# Patient Record
Sex: Male | Born: 1975 | Race: White | Hispanic: Yes | Smoking: Never smoker
Health system: Southern US, Community
[De-identification: ages and names within clinical notes are randomized; demographics above are authoritative.]

---

## 2008-06-01 ENCOUNTER — Emergency Department (HOSPITAL_COMMUNITY): Admission: EM | Admit: 2008-06-01 | Discharge: 2008-06-01 | Payer: Self-pay | Admitting: *Deleted

## 2019-10-07 ENCOUNTER — Emergency Department (HOSPITAL_BASED_OUTPATIENT_CLINIC_OR_DEPARTMENT_OTHER)
Admission: EM | Admit: 2019-10-07 | Discharge: 2019-10-07 | Disposition: A | Discharge status: Home / Self Care | Payer: Self-pay | Attending: Emergency Medicine | Admitting: Emergency Medicine

## 2019-10-07 ENCOUNTER — Encounter (HOSPITAL_BASED_OUTPATIENT_CLINIC_OR_DEPARTMENT_OTHER): Payer: Self-pay | Admitting: Emergency Medicine

## 2019-10-07 ENCOUNTER — Encounter (HOSPITAL_COMMUNITY): Payer: Self-pay

## 2019-10-07 ENCOUNTER — Other Ambulatory Visit: Payer: Self-pay

## 2019-10-07 ENCOUNTER — Ambulatory Visit (HOSPITAL_COMMUNITY)
Admission: EM | Admit: 2019-10-07 | Discharge: 2019-10-07 | Disposition: A | Discharge status: Home / Self Care | Payer: Self-pay | Attending: Family Medicine | Admitting: Family Medicine

## 2019-10-07 ENCOUNTER — Emergency Department (HOSPITAL_BASED_OUTPATIENT_CLINIC_OR_DEPARTMENT_OTHER): Payer: Self-pay

## 2019-10-07 DIAGNOSIS — N23 Unspecified renal colic: Secondary | ICD-10-CM

## 2019-10-07 DIAGNOSIS — N200 Calculus of kidney: Secondary | ICD-10-CM

## 2019-10-07 DIAGNOSIS — N201 Calculus of ureter: Secondary | ICD-10-CM | POA: Insufficient documentation

## 2019-10-07 DIAGNOSIS — R109 Unspecified abdominal pain: Secondary | ICD-10-CM

## 2019-10-07 LAB — COMPREHENSIVE METABOLIC PANEL
ALT: 28 U/L (ref 0–44)
AST: 27 U/L (ref 15–41)
Albumin: 4.7 g/dL (ref 3.5–5.0)
Alkaline Phosphatase: 71 U/L (ref 38–126)
Anion gap: 10 (ref 5–15)
BUN: 20 mg/dL (ref 6–20)
CO2: 22 mmol/L (ref 22–32)
Calcium: 9.1 mg/dL (ref 8.9–10.3)
Chloride: 104 mmol/L (ref 98–111)
Creatinine, Ser: 1.3 mg/dL — ABNORMAL HIGH (ref 0.61–1.24)
GFR calc Af Amer: >60 mL/min (ref >60)
GFR calc non Af Amer: >60 mL/min (ref >60)
Glucose, Bld: 135 mg/dL — ABNORMAL HIGH (ref 70–99)
Potassium: 4.2 mmol/L (ref 3.5–5.1)
Sodium: 136 mmol/L (ref 135–145)
Total Bilirubin: 1 mg/dL (ref 0.3–1.2)
Total Protein: 7.8 g/dL (ref 6.5–8.1)

## 2019-10-07 LAB — POCT URINALYSIS DIP (DEVICE)
Bilirubin Urine: NEGATIVE
Glucose, UA: NEGATIVE mg/dL
Ketones, ur: NEGATIVE mg/dL
Leukocytes,Ua: NEGATIVE
Nitrite: NEGATIVE
Protein, ur: 100 mg/dL — AB
Specific Gravity, Urine: >=1.03 (ref 1.005–1.030)
Urobilinogen, UA: 1 mg/dL (ref 0.0–1.0)
pH: 6 (ref 5.0–8.0)

## 2019-10-07 LAB — CBC
HCT: 43.2 % (ref 39.0–52.0)
Hemoglobin: 14.5 g/dL (ref 13.0–17.0)
MCH: 30.4 pg (ref 26.0–34.0)
MCHC: 33.6 g/dL (ref 30.0–36.0)
MCV: 90.6 fL (ref 80.0–100.0)
Platelets: 274 10*3/uL (ref 150–400)
RBC: 4.77 MIL/uL (ref 4.22–5.81)
RDW: 12.2 % (ref 11.5–15.5)
WBC: 11.9 10*3/uL — ABNORMAL HIGH (ref 4.0–10.5)
nRBC: 0 % (ref 0.0–0.2)

## 2019-10-07 MED ORDER — IBUPROFEN 600 MG PO TABS: 600.0000 mg | ORAL_TABLET | Freq: Four times a day (QID) | ORAL | 0 refills | Status: AC | PRN

## 2019-10-07 MED ORDER — ONDANSETRON 4 MG PO TBDP
4.0000 mg | ORAL_TABLET | Freq: Once | ORAL | Status: AC
  Administered 2019-10-07: 4 mg via ORAL

## 2019-10-07 MED ORDER — TAMSULOSIN HCL 0.4 MG PO CAPS: 0.4000 mg | ORAL_CAPSULE | Freq: Every day | ORAL | 0 refills | Status: AC

## 2019-10-07 MED ORDER — HYDROCODONE-ACETAMINOPHEN 5-325 MG PO TABS
ORAL_TABLET | ORAL | Status: AC
  Filled 2019-10-07: qty 2

## 2019-10-07 MED ORDER — OXYCODONE-ACETAMINOPHEN 5-325 MG PO TABS: 1.0000 | ORAL_TABLET | Freq: Four times a day (QID) | ORAL | 0 refills | Status: AC | PRN

## 2019-10-07 MED ORDER — HYDROCODONE-ACETAMINOPHEN 5-325 MG PO TABS
2.0000 | ORAL_TABLET | Freq: Once | ORAL | Status: AC
  Administered 2019-10-07: 15:00:00 2 via ORAL

## 2019-10-07 MED ORDER — ONDANSETRON 4 MG PO TBDP
ORAL_TABLET | ORAL | Status: AC
  Filled 2019-10-07: qty 1

## 2019-10-07 NOTE — ED Triage Notes (Signed)
Pt states he has back pain. Pt states he thinks it kidney stones. This started today.

## 2019-10-07 NOTE — Discharge Instructions (Addendum)
I believe you have a Kidney stone. You need at CT Scan and I am recommending you go to the Emergency Department for this.   Go to the Emergency Department of your choice

## 2019-10-07 NOTE — ED Triage Notes (Signed)
R flank pain since 9am with N/V. Sent from UC to r/o kidney stone. Given norco and zofran at [facility] with relief.

## 2019-10-07 NOTE — ED Notes (Signed)
Patient is being discharged from the Urgent Care Center and sent to the Emergency Department via wheelchair by staff. Per Leta Jungling, pa, patient is stable but in need of higher level of care due to evaluation of potential kidney stone. Patient is aware and verbalizes understanding of plan of care.  Vitals:   10/07/19 1337  BP: 136/83  Pulse: 60  Resp: 16  Temp: 98.1 F (36.7 C)  SpO2: 100%

## 2019-10-07 NOTE — ED Provider Notes (Signed)
[facility] EMERGENCY DEPARTMENT Provider Note   CSN: [id number] Arrival date & time: 10/07/19  1508     History Chief Complaint  Patient presents with  . Flank Pain    Zachary Todd is a 44 y.o. male w/ hx of kidney stones (~10 years ago) presenting with 1 day of sudden onset right sided flank pain and vomiting.  Woke up with this this morning.  Sharp pain in right flank that radiates toward groin.  No changes in urination.  Similar to pain from kidney stone many year ago.  No fevers, chills, nausea, vomiting, diarrhea.  No testicular pain.   He says he was given "two pills" at urgent care and his pain has nearly completely resolved.  He went to [facility] and was referred to the ED for CT scan for renal stone & hydronephrosis evaluation   NKDA No other medical problems Takes no meds at baseline  HPI     History reviewed. No pertinent past medical history.  There are no problems to display for this patient.   History reviewed. No pertinent surgical history.     No family history on file.  Social History   Tobacco Use  . Smoking status: Never Smoker  . Smokeless tobacco: Never Used  Substance Use Topics  . Alcohol use: Yes  . Drug use: Not on file    Home Medications Prior to Admission medications   Medication Sig Start Date End Date Taking? Authorizing Provider  ibuprofen (ADVIL) 600 MG tablet Take 1 tablet (600 mg total) by mouth every 6 (six) hours as needed. 10/07/19   Terald Sleeper, MD  oxyCODONE-acetaminophen (PERCOCET/ROXICET) 5-325 MG tablet Take 1 tablet by mouth every 6 (six) hours as needed for up to 3 days for severe pain. 10/07/19 10/10/19  Terald Sleeper, MD  tamsulosin (FLOMAX) 0.4 MG CAPS capsule Take 1 capsule (0.4 mg total) by mouth daily for 15 doses. 10/07/19 10/22/19  Terald Sleeper, MD    Allergies    Patient has no known allergies.  Review of Systems   Review of Systems  Constitutional: Negative for chills and fever.  Respiratory:  Negative for cough and shortness of breath.   Cardiovascular: Negative for chest pain and palpitations.  Gastrointestinal: Positive for abdominal pain. Negative for diarrhea and vomiting.  Genitourinary: Positive for flank pain. Negative for difficulty urinating, discharge, dysuria, hematuria and testicular pain.  Skin: Negative for pallor and rash.  Psychiatric/Behavioral: Negative for agitation and confusion.  All other systems reviewed and are negative.   Physical Exam Updated Vital Signs BP 116/81 (BP Location: Right Arm)   Pulse 62   Temp 97.8 F (36.6 C) (Oral)   Resp 20   Ht 5\' 8"  (1.727 m)   Wt 81.6 kg   SpO2 98%   BMI 27.37 kg/m   Physical Exam Vitals and nursing note reviewed.  Constitutional:      Appearance: He is well-developed.  HENT:     Head: Normocephalic and atraumatic.  Eyes:     Conjunctiva/sclera: Conjunctivae normal.  Cardiovascular:     Rate and Rhythm: Normal rate and regular rhythm.     Pulses: Normal pulses.  Pulmonary:     Effort: Pulmonary effort is normal. No respiratory distress.  Abdominal:     Palpations: Abdomen is soft.     Tenderness: There is no abdominal tenderness. There is no right CVA tenderness, left CVA tenderness, guarding or rebound. Negative signs include Murphy's sign and McBurney's sign.  Musculoskeletal:  Cervical back: Neck supple.  Skin:    General: Skin is warm and dry.  Neurological:     Mental Status: He is alert.  Psychiatric:        Mood and Affect: Mood normal.        Behavior: Behavior normal.     ED Results / Procedures / Treatments   Labs (all labs ordered are listed, but only abnormal results are displayed) Labs Reviewed  CBC - Abnormal; Notable for the following components:      Result Value   WBC 11.9 (*)    All other components within normal limits  COMPREHENSIVE METABOLIC PANEL - Abnormal; Notable for the following components:   Glucose, Bld 135 (*)    Creatinine, Ser 1.30 (*)    All other  components within normal limits    EKG None  Radiology CT Renal Stone Study  Result Date: 10/07/2019 CLINICAL DATA:  Right-sided flank pain for several hours EXAM: CT ABDOMEN AND PELVIS WITHOUT CONTRAST TECHNIQUE: Multidetector CT imaging of the abdomen and pelvis was performed following the standard protocol without IV contrast. COMPARISON:  None. FINDINGS: Lower chest: No acute abnormality. Hepatobiliary: No focal liver abnormality is seen. No gallstones, gallbladder wall thickening, or biliary dilatation. Pancreas: Unremarkable. No pancreatic ductal dilatation or surrounding inflammatory changes. Spleen: Normal in size without focal abnormality. Adrenals/Urinary Tract: Adrenal glands are within normal limits. The left kidney is within normal limits. Right kidney demonstrates mild hydronephrosis secondary to a proximal ureteral stone which measures approximately 4 mm in greatest dimension. The more distal right ureter is unremarkable. The bladder is well distended. Stomach/Bowel: The appendix is within normal limits. No obstructive or inflammatory changes of the large or small bowel are seen. The stomach is decompressed. Vascular/Lymphatic: No significant vascular findings are present. No enlarged abdominal or pelvic lymph nodes. Reproductive: Prostate is unremarkable. Other: No abdominal wall hernia or abnormality. No abdominopelvic ascites. Musculoskeletal: No acute or significant osseous findings. IMPRESSION: 4 mm proximal right ureteral stone with obstructive change. No other focal abnormality is noted. Electronically Signed   By: Inez Catalina M.D.   On: 10/07/2019 17:44    Procedures Procedures (including critical care time)  Medications Ordered in ED Medications - No data to display  ED Course  I have reviewed the triage vital signs and the nursing notes.  Pertinent labs & imaging results that were available during my care of the patient were reviewed by me and considered in my medical  decision making (see chart for details).   This is a well appearing 44 yo male presenting to ED with right sided kidney stone, found on CT here to measure approx 4 mm, with mild right sided hydronephrosis.  Stone is proximal ureter.  UA does not show signs of infection.  I advised patient his pain may return as the stone travels further down the ureter.  Will prescribe pain medications, flomax.  He verbalizes understanding.  No signs of symptoms of pyelonephritis or sepsis at this time.  Final Clinical Impression(s) / ED Diagnoses Final diagnoses:  Right flank pain  Kidney stone  Ureteral colic    Rx / DC Orders ED Discharge Orders         Ordered    oxyCODONE-acetaminophen (PERCOCET/ROXICET) 5-325 MG tablet  Every 6 hours PRN     10/07/19 2034    ibuprofen (ADVIL) 600 MG tablet  Every 6 hours PRN     10/07/19 2034    tamsulosin (FLOMAX) 0.4 MG CAPS capsule  Daily     10/07/19 2034           Terald Sleeper, MD 10/08/19 1213

## 2019-10-07 NOTE — ED Provider Notes (Addendum)
[facility]    CSN: [id number] Arrival date & time: 10/07/19  1118      History   Chief Complaint Chief Complaint  Patient presents with  . Back Pain    HPI Zachary Todd is a 44 y.o. male.   Patient reports to urgent care today for back pain, nausea and vomiting that started suddenly this morning. Patient reports feeling sudden mid to lower back pain followed by nausea and vomiting. He reports pain has been 8/10 constantly and laying down gives him a little relief. Sitting up makes the pain worse. He reports mild decrease in urine production and reports mild painful urination. He denies seeing blood in his urine. He believes the pain is getting worse.   He reports a remote history of a kidney stone and believes this is similar but this pain is worse. He denies fever and chills. No diarrhea.   As well, patient reports being sent to this location from a [facility] We discussed whether he was directed to this urgent care or the Emergency department and he is certain they said urgent care.      History reviewed. No pertinent past medical history.  There are no problems to display for this patient.   History reviewed. No pertinent surgical history.     Home Medications    Prior to Admission medications   Not on File    Family History History reviewed. No pertinent family history.  Social History Social History   Tobacco Use  . Smoking status: Never Smoker  . Smokeless tobacco: Never Used  Substance Use Topics  . Alcohol use: Yes  . Drug use: Not on file     Allergies   Patient has no known allergies.   Review of Systems Review of Systems  Constitutional: Positive for activity change and appetite change. Negative for chills and fatigue.  Respiratory: Negative for cough and shortness of breath.   Cardiovascular: Negative for chest pain.  Gastrointestinal: Positive for nausea and vomiting. Negative for abdominal pain and diarrhea.  Genitourinary:  Positive for decreased urine volume, dysuria and flank pain. Negative for difficulty urinating, discharge, frequency, hematuria, penile pain and urgency.  Musculoskeletal: Positive for back pain.  Skin: Negative for rash.  Neurological: Negative for dizziness and headaches.     Physical Exam Triage Vital Signs ED Triage Vitals  Enc Vitals Group     BP 10/07/19 1337 136/83     Pulse Rate 10/07/19 1337 60     Resp 10/07/19 1337 16     Temp 10/07/19 1337 98.1 F (36.7 C)     Temp Source 10/07/19 1337 Oral     SpO2 10/07/19 1337 100 %     Weight 10/07/19 1342 180 lb (81.6 kg)     Height --      Head Circumference --      Peak Flow --      Pain Score 10/07/19 1342 8     Pain Loc --      Pain Edu? --      Excl. in GC? --    No data found.  Updated Vital Signs BP 136/83 (BP Location: Left Arm)   Pulse 60   Temp 98.1 F (36.7 C) (Oral)   Resp 16   Wt 180 lb (81.6 kg)   SpO2 100%   Visual Acuity Right Eye Distance:   Left Eye Distance:   Bilateral Distance:    Right Eye Near:   Left Eye Near:  Bilateral Near:     Physical Exam Vitals and nursing note reviewed.  Constitutional:      General: He is in acute distress.     Appearance: He is well-developed. He is not diaphoretic.  HENT:     Head: Normocephalic and atraumatic.  Eyes:     Extraocular Movements: Extraocular movements intact.     Conjunctiva/sclera: Conjunctivae normal.     Pupils: Pupils are equal, round, and reactive to light.  Cardiovascular:     Rate and Rhythm: Normal rate.  Pulmonary:     Effort: Pulmonary effort is normal. No respiratory distress.  Abdominal:     Palpations: Abdomen is soft.     Tenderness: There is no abdominal tenderness. There is right CVA tenderness. There is no left CVA tenderness.  Musculoskeletal:     Cervical back: Normal range of motion and neck supple.     Right lower leg: No edema.     Left lower leg: No edema.  Skin:    General: Skin is warm and dry.    Neurological:     General: No focal deficit present.     Mental Status: He is alert and oriented to person, place, and time.  Psychiatric:        Mood and Affect: Mood normal.        Behavior: Behavior normal.        Thought Content: Thought content normal.        Judgment: Judgment normal.      UC Treatments / Results  Labs (all labs ordered are listed, but only abnormal results are displayed) Labs Reviewed  POCT URINALYSIS DIP (DEVICE) - Abnormal; Notable for the following components:      Result Value   Hgb urine dipstick LARGE (*)    Protein, ur 100 (*)    All other components within normal limits  URINE CULTURE    EKG   Radiology No results found.  Procedures Procedures (including critical care time)  Medications Ordered in UC Medications  HYDROcodone-acetaminophen (NORCO/VICODIN) 5-325 MG per tablet 2 tablet (2 tablets Oral Given 10/07/19 1438)  ondansetron (ZOFRAN-ODT) disintegrating tablet 4 mg (4 mg Oral Given 10/07/19 1438)    Initial Impression / Assessment and Plan / UC Course  I have reviewed the triage vital signs and the nursing notes.  Pertinent labs & imaging results that were available during my care of the patient were reviewed by me and considered in my medical decision making (see chart for details).     #kidney Stone - History, exam and UA consistent with kidney stone. Has a history in 2009 of a 25mm ureteral stone. Was treated with flomax and pain meds, passed without issue in 3-4 days. Patient had worsening appearance during visit and increasing pain. Given history and presentation, feel need for imaging today. Recommending to go to Emergency department for CT Abdomen and Pelvis. 2x norco and zofran given in clinic.   Final Clinical Impressions(s) / UC Diagnoses   Final diagnoses:  Kidney stone     Discharge Instructions     I believe you have a Kidney stone. You need at CT Scan and I am recommending you go to the Emergency Department for  this.   Go to the Emergency Department of your choice    ED Prescriptions    None     PDMP not reviewed this encounter.   Purnell Shoemaker, PA-C 10/07/19 1440    Sheron Robin, Marguerita Beards, PA-C 10/07/19 1441

## 2019-10-08 LAB — URINE CULTURE: Culture: <10000 — AB

## 2021-08-17 IMAGING — CT CT RENAL STONE PROTOCOL
2 of 4 series · 17 of 46 positions shown, 19 images · non-contrast
Comparison: None.

CLINICAL DATA: Right-sided flank pain for several hours

EXAM:
CT ABDOMEN AND PELVIS WITHOUT CONTRAST
TECHNIQUE: Multidetector CT imaging of the abdomen and pelvis was performed
following the standard protocol without IV contrast.

[Series 2: axial st · axial · 0.88mm/px · z∈[-580,-120]mm · 14 of 102 slices shown, 16 images]
[im 5/102  soft-tissue]
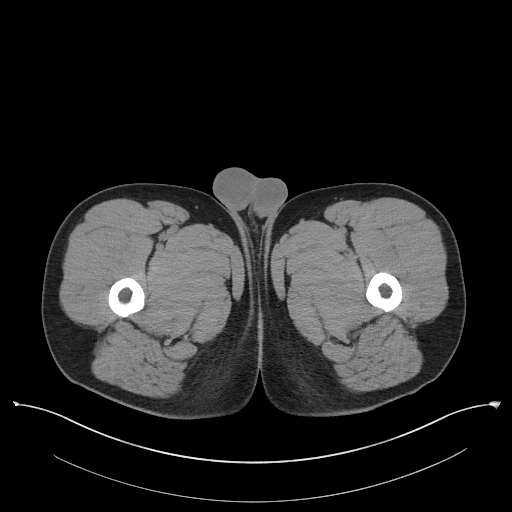
[im 5/102  bone]
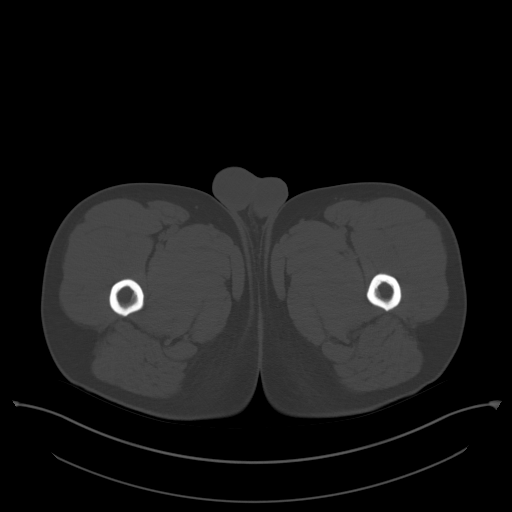
[im 13/102  soft-tissue]
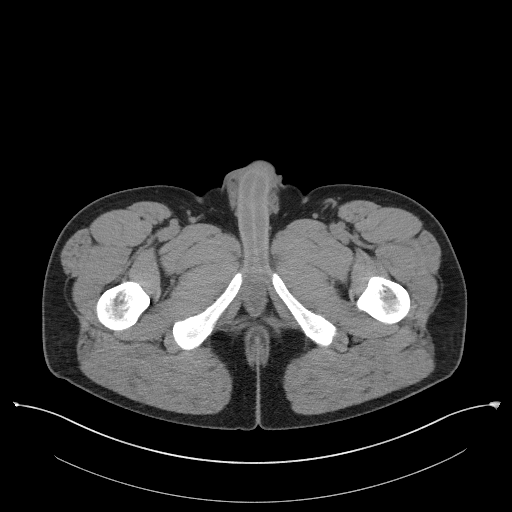
[im 22/102  soft-tissue]
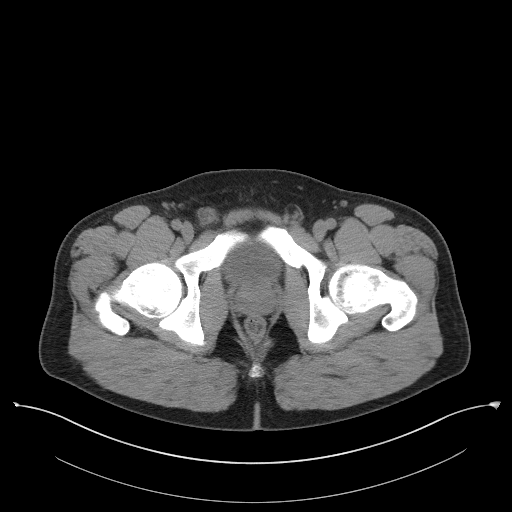
[im 26/102  soft-tissue]
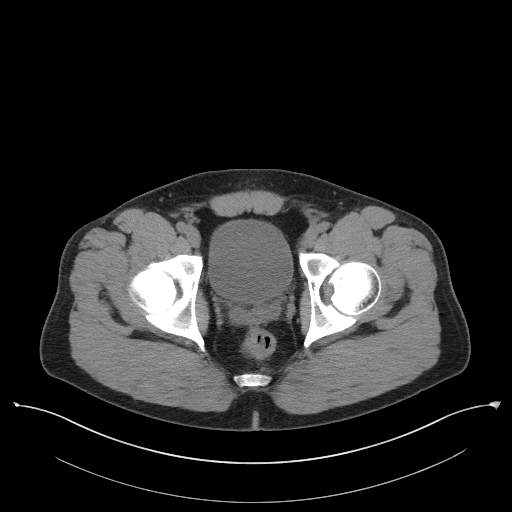
[im 34/102  soft-tissue]
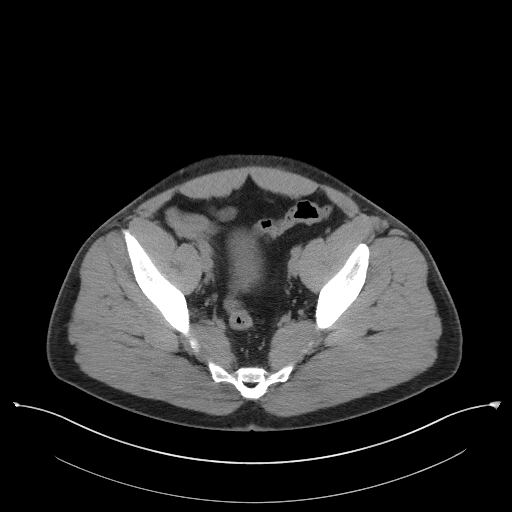
[im 43/102  soft-tissue]
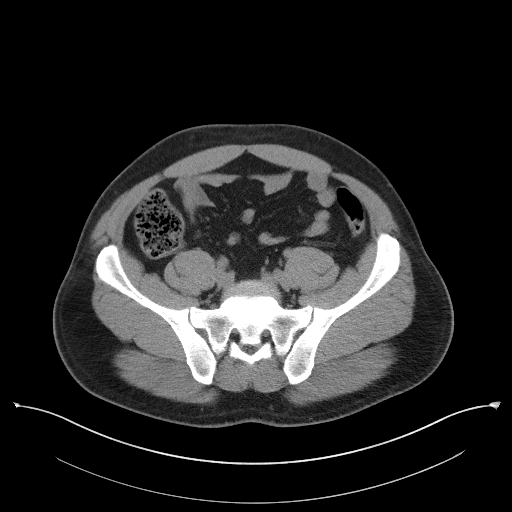
[im 47/102  soft-tissue]
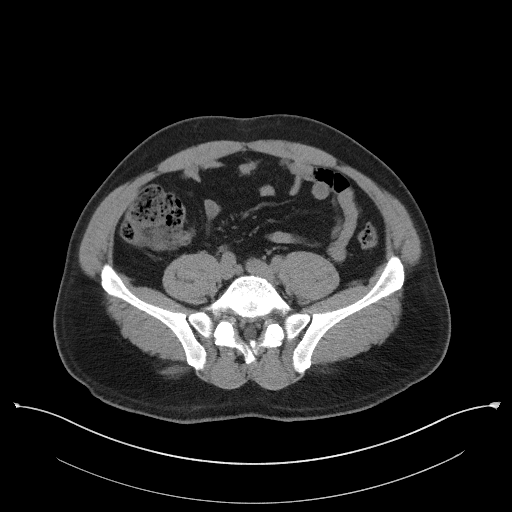
[im 55/102  soft-tissue]
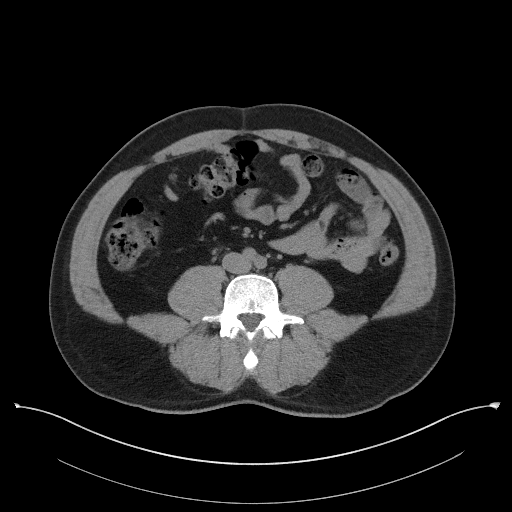
[im 59/102  soft-tissue]
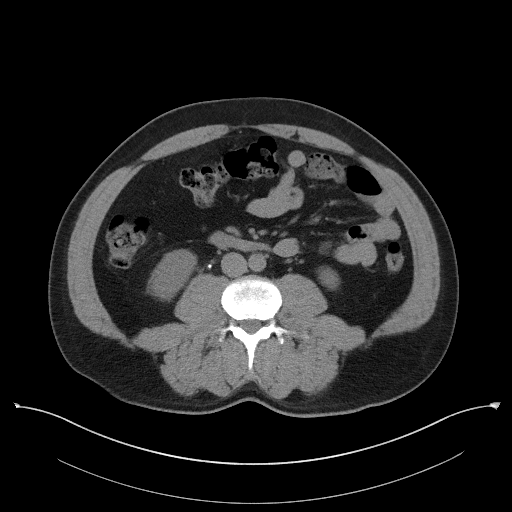
[im 59/102  bone]
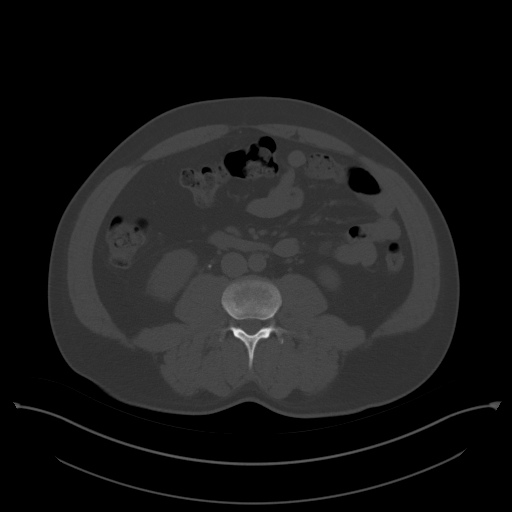
[im 68/102  soft-tissue]
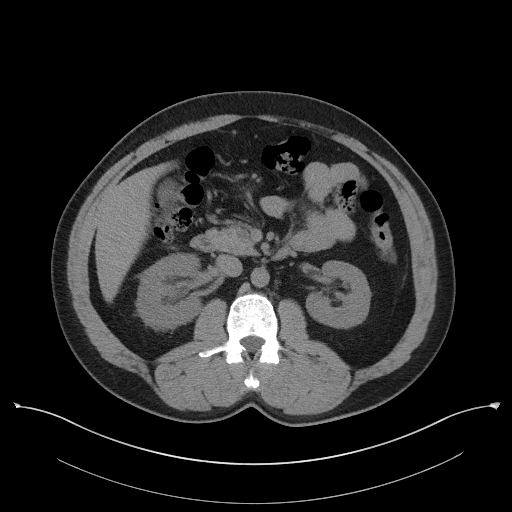
[im 76/102  soft-tissue]
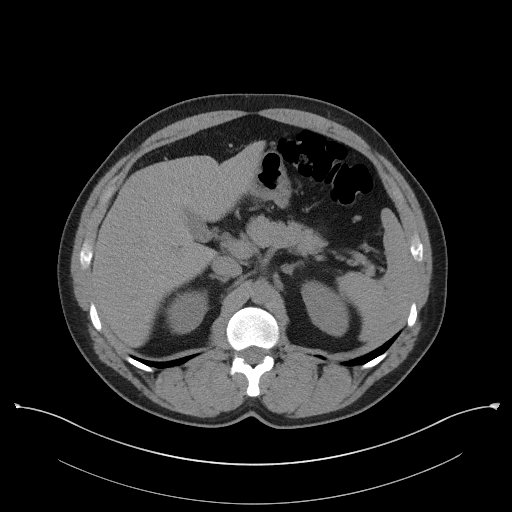
[im 80/102  soft-tissue]
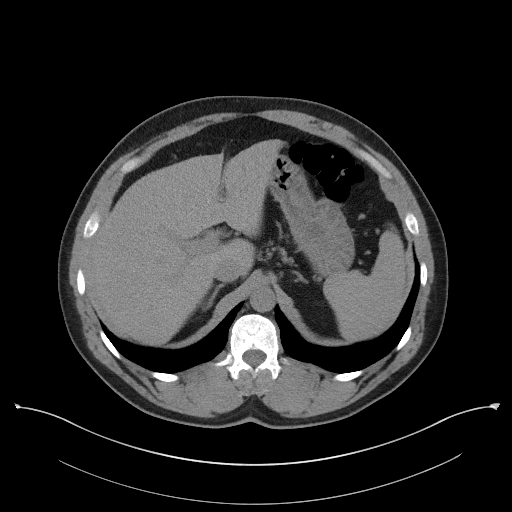
[im 89/102  soft-tissue]
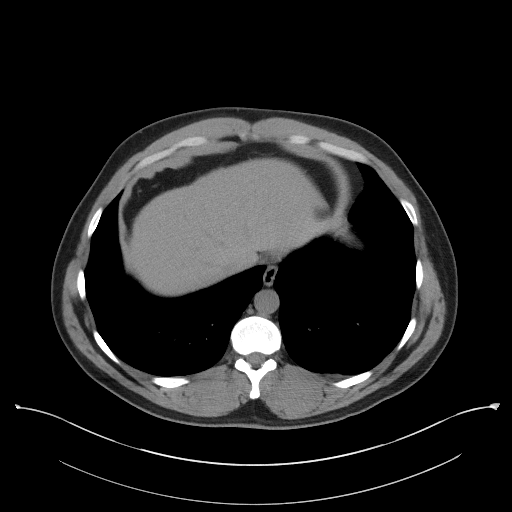
[im 97/102  soft-tissue]
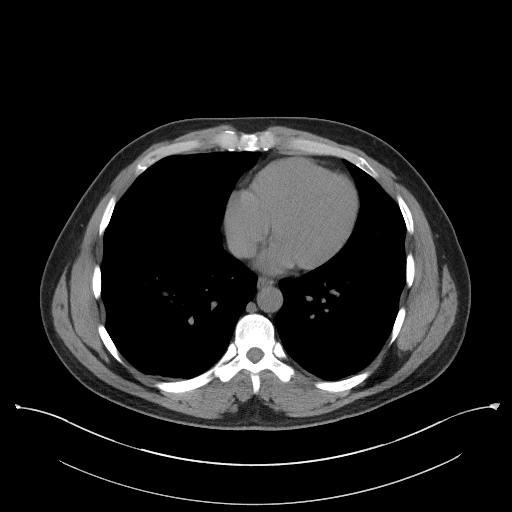

[Series 5: coronal st · coronal · 0.87mm/px · 3 of 104 slices shown]
[im 35/104  soft-tissue]
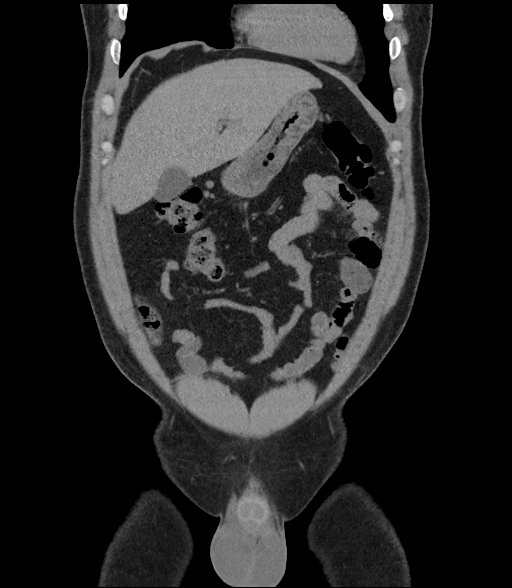
[im 46/104  soft-tissue]
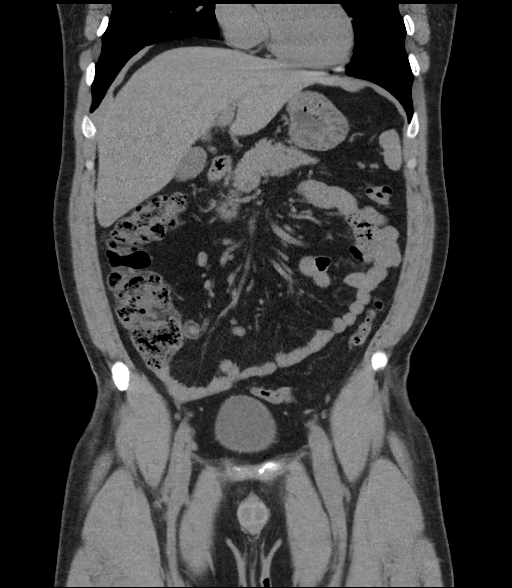
[im 58/104  soft-tissue]
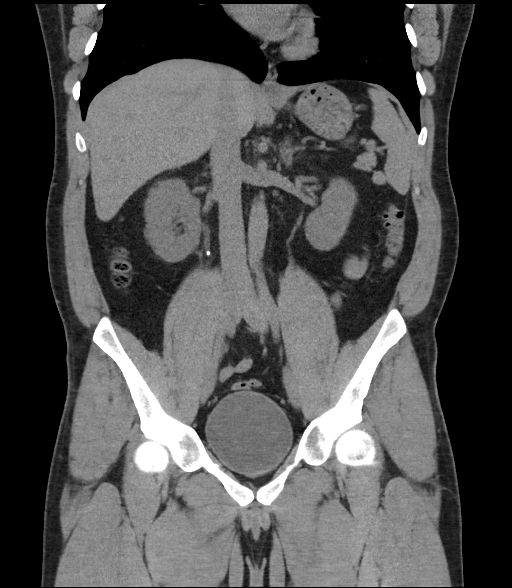

[17 of 46 positions shown; findings below may reference images not displayed]

FINDINGS: Lower chest: No acute abnormality.

Hepatobiliary: No focal liver abnormality is seen. No gallstones,
gallbladder wall thickening, or biliary dilatation.

Pancreas: Unremarkable. No pancreatic ductal dilatation or
surrounding inflammatory changes.

Spleen: Normal in size without focal abnormality.

Adrenals/Urinary Tract: Adrenal glands are within normal limits. The
left kidney is within normal limits. Right kidney demonstrates mild
hydronephrosis secondary to a proximal ureteral stone which measures
approximately 4 mm in greatest dimension. The more distal right
ureter is unremarkable. The bladder is well distended.

Stomach/Bowel: The appendix is within normal limits. No obstructive
or inflammatory changes of the large or small bowel are seen. The
stomach is decompressed.

Vascular/Lymphatic: No significant vascular findings are present. No
enlarged abdominal or pelvic lymph nodes.

Reproductive: Prostate is unremarkable.

Other: No abdominal wall hernia or abnormality. No abdominopelvic
ascites.

Musculoskeletal: No acute or significant osseous findings.
IMPRESSION: 4 mm proximal right ureteral stone with obstructive change.

No other focal abnormality is noted.
